# Patient Record
Sex: Female | Born: 1980 | Hispanic: Yes | Marital: Single | Smoking: Never smoker
Health system: Southern US, Community
[De-identification: ages and names within clinical notes are randomized; demographics above are authoritative.]

## PROBLEM LIST (undated history)

## (undated) DIAGNOSIS — D649 Anemia, unspecified: Secondary | ICD-10-CM

## (undated) HISTORY — PX: ABDOMINAL HYSTERECTOMY: SHX81

---

## 2004-08-07 ENCOUNTER — Ambulatory Visit: Payer: Self-pay | Admitting: *Deleted

## 2006-12-25 ENCOUNTER — Ambulatory Visit: Payer: Self-pay | Admitting: Family Medicine

## 2007-12-20 ENCOUNTER — Emergency Department: Payer: Self-pay | Admitting: Emergency Medicine

## 2018-04-01 ENCOUNTER — Emergency Department
Admission: EM | Admit: 2018-04-01 | Discharge: 2018-04-01 | Disposition: A | Discharge status: Home / Self Care | Payer: Self-pay | Attending: Student in an Organized Health Care Education/Training Program | Admitting: Student in an Organized Health Care Education/Training Program

## 2018-04-01 ENCOUNTER — Encounter: Payer: Self-pay | Admitting: Emergency Medicine

## 2018-04-01 DIAGNOSIS — D509 Iron deficiency anemia, unspecified: Secondary | ICD-10-CM

## 2018-04-01 HISTORY — DX: Anemia, unspecified: D64.9

## 2018-04-01 LAB — FERRITIN: Ferritin: 1 ng/mL — ABNORMAL LOW (ref 11–307)

## 2018-04-01 LAB — IRON AND TIBC
Iron: 12 ug/dL — ABNORMAL LOW (ref 28–170)
Saturation Ratios: 2 % — ABNORMAL LOW (ref 10.4–31.8)
TIBC: 522 ug/dL — ABNORMAL HIGH (ref 250–450)
UIBC: 510 ug/dL

## 2018-04-01 LAB — COMPREHENSIVE METABOLIC PANEL
ALT: 24 U/L (ref 0–44)
ANION GAP: 9 (ref 5–15)
AST: 19 U/L (ref 15–41)
Albumin: 4.1 g/dL (ref 3.5–5.0)
Alkaline Phosphatase: 61 U/L (ref 38–126)
BILIRUBIN TOTAL: 0.3 mg/dL (ref 0.3–1.2)
BUN: 14 mg/dL (ref 6–20)
CHLORIDE: 104 mmol/L (ref 98–111)
CO2: 24 mmol/L (ref 22–32)
Calcium: 9.1 mg/dL (ref 8.9–10.3)
Creatinine, Ser: 0.63 mg/dL (ref 0.44–1.00)
GFR calc Af Amer: >60 mL/min (ref >60)
GFR calc non Af Amer: >60 mL/min (ref >60)
GLUCOSE: 108 mg/dL — AB (ref 70–99)
POTASSIUM: 4 mmol/L (ref 3.5–5.1)
Sodium: 137 mmol/L (ref 135–145)
Total Protein: 7.6 g/dL (ref 6.5–8.1)

## 2018-04-01 LAB — URINALYSIS, COMPLETE (UACMP) WITH MICROSCOPIC
BACTERIA UA: NONE SEEN
BILIRUBIN URINE: NEGATIVE
Glucose, UA: NEGATIVE mg/dL
Hgb urine dipstick: NEGATIVE
KETONES UR: NEGATIVE mg/dL
LEUKOCYTES UA: NEGATIVE
Nitrite: NEGATIVE
PH: 6 (ref 5.0–8.0)
PROTEIN: NEGATIVE mg/dL
Specific Gravity, Urine: 1.003 — ABNORMAL LOW (ref 1.005–1.030)

## 2018-04-01 LAB — CBC
HEMATOCRIT: 24.2 % — AB (ref 36.0–46.0)
HEMOGLOBIN: 6.8 g/dL — AB (ref 12.0–15.0)
MCH: 18.1 pg — AB (ref 26.0–34.0)
MCHC: 28.1 g/dL — ABNORMAL LOW (ref 30.0–36.0)
MCV: 64.4 fL — AB (ref 80.0–100.0)
NRBC: 0 % (ref 0.0–0.2)
Platelets: 492 10*3/uL — ABNORMAL HIGH (ref 150–400)
RBC: 3.76 MIL/uL — AB (ref 3.87–5.11)
RDW: 19.1 % — ABNORMAL HIGH (ref 11.5–15.5)
WBC: 8 10*3/uL (ref 4.0–10.5)

## 2018-04-01 LAB — TYPE AND SCREEN
ABO/RH(D): A POS
ANTIBODY SCREEN: NEGATIVE

## 2018-04-01 LAB — FOLATE: Folate: 6.8 ng/mL (ref >5.9)

## 2018-04-01 MED ORDER — FERROUS SULFATE 325 (65 FE) MG PO TABS
325.0000 mg | ORAL_TABLET | Freq: Once | ORAL | Status: AC
  Administered 2018-04-01: 325 mg via ORAL
  Filled 2018-04-01: qty 1

## 2018-04-01 MED ORDER — IRON 325 (65 FE) MG PO TABS: 1.0000 | ORAL_TABLET | ORAL | 4 refills | Status: AC

## 2018-04-01 NOTE — ED Notes (Signed)
Pharmacy notified to send Ferrous Sulfate tablet.

## 2018-04-01 NOTE — ED Triage Notes (Signed)
Pt denies symptoms of any kind. States she was a little tired but didn't think anything of it.

## 2018-04-01 NOTE — ED Provider Notes (Signed)
[facility] Emergency Department Provider Note    First MD Initiated Contact with Patient 04/01/18 1733     (approximate)  I have reviewed the triage vital signs and the nursing notes.   HISTORY  Chief Complaint Abnormal Lab    HPI Karen Huff is a 37 y.o. female with a history of iron deficiency anemia presents the ER for evaluation of abnormal hemoglobin levels done at clinic this past Tuesday.  States that she does have a history of heavy menstrual bleeding with the last menstrual cycle being last week.  Denies any active bleeding.  Denies any melena or hematochezia.  States that she previously took iron supplementation but has not done that in several months.  Denies any chest pain or shortness of breath.  States she has felt a little bit more fatigued over the past few weeks.  No recent trauma.     Past Medical History:  Diagnosis Date  . Anemia    No family history on file. History reviewed. No pertinent surgical history. There are no active problems to display for this patient.     Prior to Admission medications   Medication Sig Start Date End Date Taking? Authorizing Provider  Ferrous Sulfate (IRON) 325 (65 Fe) MG TABS Take 1 tablet (325 mg total) by mouth every other day. 04/01/18   Willy Eddyobinson, Clotile Whittington, MD    Allergies Patient has no allergy information on record.    Social History Social History   Tobacco Use  . Smoking status: Not on file  Substance Use Topics  . Alcohol use: Not on file  . Drug use: Not on file    Review of Systems Patient denies headaches, rhinorrhea, blurry vision, numbness, shortness of breath, chest pain, edema, cough, abdominal pain, nausea, vomiting, diarrhea, dysuria, fevers, rashes or hallucinations unless otherwise stated above in HPI. ____________________________________________   PHYSICAL EXAM:  VITAL SIGNS: Vitals:   04/01/18 1815 04/01/18 1817  BP: 125/80   Pulse:  82  Resp:      Temp:    SpO2:  100%    Constitutional: Alert and oriented.  Eyes: Conjunctivae are normal.  Head: Atraumatic. Nose: No congestion/rhinnorhea. Mouth/Throat: Mucous membranes are moist.   Neck: No stridor. Painless ROM.  Cardiovascular: Normal rate, regular rhythm. Grossly normal heart sounds.  Good peripheral circulation. Respiratory: Normal respiratory effort.  No retractions. Lungs CTAB. Gastrointestinal: Soft and nontender. No distention. No abdominal bruits. No CVA tenderness. Genitourinary: deferred Musculoskeletal: No lower extremity tenderness nor edema.  No joint effusions. Neurologic:  Normal speech and language. No gross focal neurologic deficits are appreciated. No facial droop Skin:  Skin is warm, dry and intact. No rash noted. Psychiatric: Mood and affect are normal. Speech and behavior are normal.  ____________________________________________   LABS (all labs ordered are listed, but only abnormal results are displayed)  Results for orders placed or performed during the hospital encounter of 04/01/18 (from the past 24 hour(s))  CBC     Status: Abnormal   Collection Time: 04/01/18  4:32 PM  Result Value Ref Range   WBC 8.0 4.0 - 10.5 K/uL   RBC 3.76 (L) 3.87 - 5.11 MIL/uL   Hemoglobin 6.8 (L) 12.0 - 15.0 g/dL   HCT 81.124.2 (L) 91.436.0 - 78.246.0 %   MCV 64.4 (L) 80.0 - 100.0 fL   MCH 18.1 (L) 26.0 - 34.0 pg   MCHC 28.1 (L) 30.0 - 36.0 g/dL   RDW 95.619.1 (H) 21.311.5 - 08.615.5 %  Platelets 492 (H) 150 - 400 K/uL   nRBC 0.0 0.0 - 0.2 %  Urinalysis, Complete w Microscopic     Status: Abnormal   Collection Time: 04/01/18  4:32 PM  Result Value Ref Range   Color, Urine COLORLESS (A) YELLOW   APPearance CLEAR (A) CLEAR   Specific Gravity, Urine 1.003 (L) 1.005 - 1.030   pH 6.0 5.0 - 8.0   Glucose, UA NEGATIVE NEGATIVE mg/dL   Hgb urine dipstick NEGATIVE NEGATIVE   Bilirubin Urine NEGATIVE NEGATIVE   Ketones, ur NEGATIVE NEGATIVE mg/dL   Protein, ur NEGATIVE NEGATIVE mg/dL    Nitrite NEGATIVE NEGATIVE   Leukocytes, UA NEGATIVE NEGATIVE   RBC / HPF 0-5 0 - 5 RBC/hpf   WBC, UA 0-5 0 - 5 WBC/hpf   Bacteria, UA NONE SEEN NONE SEEN   Squamous Epithelial / LPF 0-5 0 - 5   Mucus PRESENT   Type and screen [facility]     Status: None   Collection Time: 04/01/18  4:32 PM  Result Value Ref Range   ABO/RH(D) A POS    Antibody Screen NEG    Sample Expiration      04/04/2018 Performed at [facility], [address] [place], [place] [address]   Comprehensive metabolic panel     Status: Abnormal   Collection Time: 04/01/18  4:32 PM  Result Value Ref Range   Sodium 137 135 - 145 mmol/L   Potassium 4.0 3.5 - 5.1 mmol/L   Chloride 104 98 - 111 mmol/L   CO2 24 22 - 32 mmol/L   Glucose, Bld 108 (H) 70 - 99 mg/dL   BUN 14 6 - 20 mg/dL   Creatinine, Ser 6.04 0.44 - 1.00 mg/dL   Calcium 9.1 8.9 - 54.0 mg/dL   Total Protein 7.6 6.5 - 8.1 g/dL   Albumin 4.1 3.5 - 5.0 g/dL   AST 19 15 - 41 U/L   ALT 24 0 - 44 U/L   Alkaline Phosphatase 61 38 - 126 U/L   Total Bilirubin 0.3 0.3 - 1.2 mg/dL   GFR calc non Af Amer >60 >60 mL/min   GFR calc Af Amer >60 >60 mL/min   Anion gap 9 5 - 15  Ferritin (Iron Binding Protein)     Status: Abnormal   Collection Time: 04/01/18  5:51 PM  Result Value Ref Range   Ferritin 1 (L) 11 - 307 ng/mL  Iron and TIBC     Status: Abnormal   Collection Time: 04/01/18  5:51 PM  Result Value Ref Range   Iron 12 (L) 28 - 170 ug/dL   TIBC 981 (H) 191 - 478 ug/dL   Saturation Ratios 2 (L) 10.4 - 31.8 %   UIBC 510 ug/dL  Folate     Status: None   Collection Time: 04/01/18  5:51 PM  Result Value Ref Range   Folate 6.8 >5.9 ng/mL   ____________________________________________ ____________________________________________  RADIOLOGY   ____________________________________________   PROCEDURES  Procedure(s) performed:  Procedures    Critical Care performed:  no ____________________________________________   INITIAL IMPRESSION / ASSESSMENT AND PLAN / ED COURSE  Pertinent labs & imaging results that were available during my care of the patient were reviewed by me and considered in my medical decision making (see chart for details).   DDX: IDA, AOCD, ABLA, malignancy  Karen Huff is a 37 y.o. who presents to the ED with symptoms and abnormal blood work as described above.  She does not have any evidence of acute blood loss anemia.  Does have history of iron deficiency anemia.  Patient is mildly pale but not ill-appearing.  Does have history of heavy vaginal bleeding but this is a chronic issue for her.  Will check iron studies.  I have a suspicion that this is iron deficiency anemia  Clinical Course as of Apr 01 1905  Thu Apr 01, 2018  1745 RDW(!): 19.1 [PR]  1857 Patient has evidence of significant iron deficiency anemia.  Does not have any evidence of acute blood loss anemia.  She is otherwise hemodynamically stable.  She is not terribly symptomatic from her anemia either seems to be more subacute.  I do think that it is in the patient's best interest that we trial oral iron supplementation given the risks associated with transfusion.   [PR]    Clinical Course User Index [PR] Willy Eddy, MD     As part of my medical decision making, I reviewed the following data within the electronic MEDICAL RECORD NUMBER Nursing notes reviewed and incorporated, Labs reviewed, notes from prior ED visits.   ____________________________________________   FINAL CLINICAL IMPRESSION(S) / ED DIAGNOSES  Final diagnoses:  Iron deficiency anemia, unspecified iron deficiency anemia type      NEW MEDICATIONS STARTED DURING THIS VISIT:  New Prescriptions   FERROUS SULFATE (IRON) 325 (65 FE) MG TABS    Take 1 tablet (325 mg total) by mouth every other day.     Note:  This document was prepared using Dragon voice recognition software and may  include unintentional dictation errors.    Willy Eddy, MD 04/01/18 [phone]

## 2018-04-01 NOTE — ED Triage Notes (Signed)
Pt reports saw her doctor and had blood work done and he called her today and told her that she needed to come to the ED because her hbg as 6. At this time the patient reports she does not need an interpreter but if she feels like she does in the future she will let us know. Pt with hx of anemia

## 2018-05-03 ENCOUNTER — Ambulatory Visit: Payer: Self-pay

## 2018-12-06 ENCOUNTER — Ambulatory Visit: Payer: Self-pay

## 2018-12-07 ENCOUNTER — Other Ambulatory Visit: Payer: Self-pay

## 2018-12-08 ENCOUNTER — Ambulatory Visit
Admission: RE | Admit: 2018-12-08 | Discharge: 2018-12-08 | Disposition: A | Discharge status: Home / Self Care | Payer: Self-pay | Source: Ambulatory Visit | Attending: Oncology | Admitting: Oncology

## 2018-12-08 ENCOUNTER — Ambulatory Visit: Payer: Self-pay | Attending: Oncology | Admitting: *Deleted

## 2018-12-08 ENCOUNTER — Other Ambulatory Visit: Payer: Self-pay

## 2018-12-08 ENCOUNTER — Encounter: Payer: Self-pay | Admitting: *Deleted

## 2018-12-08 ENCOUNTER — Encounter (INDEPENDENT_AMBULATORY_CARE_PROVIDER_SITE_OTHER): Payer: Self-pay

## 2018-12-08 VITALS — BP 113/78 | HR 75 | Temp 96.9°F | Ht 63.0 in | Wt 152.0 lb

## 2018-12-08 DIAGNOSIS — Z0000 Encounter for general adult medical examination without abnormal findings: Secondary | ICD-10-CM | POA: Insufficient documentation

## 2018-12-08 NOTE — Progress Notes (Signed)
  Subjective:     Patient ID: Azaryah Arlina Robes, female   DOB: March 06, 1981, 38 y.o.   MRN: [id number]  HPI   Review of Systems     Objective:   Physical Exam Chest:     Breasts:        Right: Tenderness present. No swelling, bleeding, inverted nipple, mass, nipple discharge or skin change.        Left: No swelling, bleeding, inverted nipple, mass, nipple discharge, skin change or tenderness.    Lymphadenopathy:     Upper Body:     Right upper body: No supraclavicular or axillary adenopathy.     Left upper body: No supraclavicular or axillary adenopathy.        Assessment:     38 year old Hispanic referred to [place] by [facility] for clinical breast exam and mammogram.  Lloyda, the interpreter present during the interview and exam.  Clinical breast exam notes tenderness to palpation at the upper outer quadrant of the right breast.  Patient is due to start her menstrual cycle this week.  Patient also has a history of right breast cysts noted on previous mammogram and ultrasound from [facility] in 2018.  There is no dominant mass, skin changes, nipple discharge or lymphadenopathy.  Taught self breast awareness.  Last pap on 03/30/18 was negative without HPV co-testing.  Next pap due in 2022.  Patient has been screened for eligibility.  She does not have any insurance, Medicare or Medicaid.  She also meets financial eligibility.  Hand-out given on the Affordable Care Act.   Risk Assessment    Risk Scores      12/08/2018   Last edited by: Theodore Demark, RN   5-year risk: 0.4 %   Lifetime risk: 8.6 %            Plan:     Screening mammogram ordered.  Will follow up per BCCCP protocol.

## 2018-12-23 ENCOUNTER — Encounter: Payer: Self-pay | Admitting: *Deleted

## 2018-12-23 NOTE — Progress Notes (Signed)
Letter mailed from the [facility] to inform patient of her normal mammogram results.  Patient is to follow-up with annual screening in one year.  HSIS to Christy. 

## 2019-02-27 HISTORY — PX: TOTAL LAPAROSCOPIC HYSTERECTOMY WITH SALPINGECTOMY: SHX6742

## 2020-01-25 ENCOUNTER — Ambulatory Visit
Admission: RE | Admit: 2020-01-25 | Discharge: 2020-01-25 | Disposition: A | Discharge status: Home / Self Care | Payer: Self-pay | Source: Ambulatory Visit | Attending: Oncology | Admitting: Oncology

## 2020-01-25 ENCOUNTER — Ambulatory Visit: Payer: Self-pay | Attending: Oncology

## 2020-01-25 ENCOUNTER — Other Ambulatory Visit: Payer: Self-pay

## 2020-01-25 VITALS — BP 123/78 | HR 65 | Temp 97.2°F | Ht 63.16 in | Wt 152.1 lb

## 2020-01-25 DIAGNOSIS — Z0000 Encounter for general adult medical examination without abnormal findings: Secondary | ICD-10-CM | POA: Insufficient documentation

## 2020-01-25 NOTE — Progress Notes (Signed)
  Subjective:     Patient ID: Karen Huff, female   DOB: April 20, 1981, 39 y.o.   MRN: [id number]  HPI   Review of Systems     Objective:   Physical Exam Chest:     Breasts:        Right: No swelling, bleeding, inverted nipple, mass, nipple discharge, skin change or tenderness.        Left: No swelling, bleeding, inverted nipple, mass, nipple discharge, skin change or tenderness.        Assessment:     39 year old Hispanic patient returns for [facility] clinic visit.  Patient screened, and meets BCCCP eligibility.  Patient does not have insurance, Medicare or Medicaid. Instructed patient on breast self awareness using teach back method.  Clinical breast exam unremarkable.  No mass or lump palpated.  Patient reports she had a hysterectomy for heavy bleeding with menstruation.  Surgery performed at [facility] November 2020.  She still has her ovaries.      Risk Assessment    Risk Scores      01/25/2020 12/08/2018   Last edited by: Scarlett Presto, RN Scarlett Presto, RN   5-year risk: 0.5 % 0.4 %   Lifetime risk: 8.5 % 8.6 %         Plan:     Sent for bilateral screening mammogram.

## 2020-02-01 NOTE — Progress Notes (Signed)
Letter mailed from [facility] to notify of normal mammogram results.  Patient to return in one year for annual screening.  Copy to HSIS. 

## 2021-01-10 IMAGING — MG DIGITAL SCREENING BILAT W/ TOMO W/ CAD
6 of 10 series · 6 of 30 positions shown · non-contrast
Comparison: Previous exam(s).

CLINICAL DATA: Screening.

EXAM:
DIGITAL SCREENING BILATERAL MAMMOGRAM WITH TOMO AND CAD

[L MLO synth-2D]
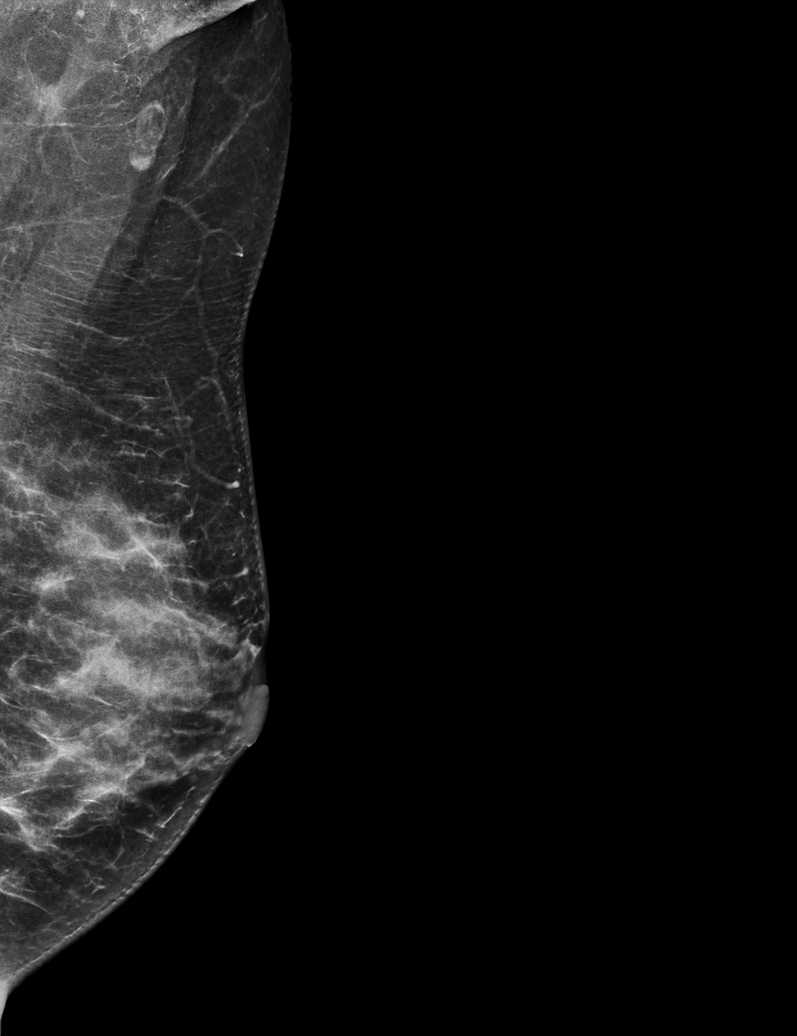

[R CC synth-2D (1 of 2)]
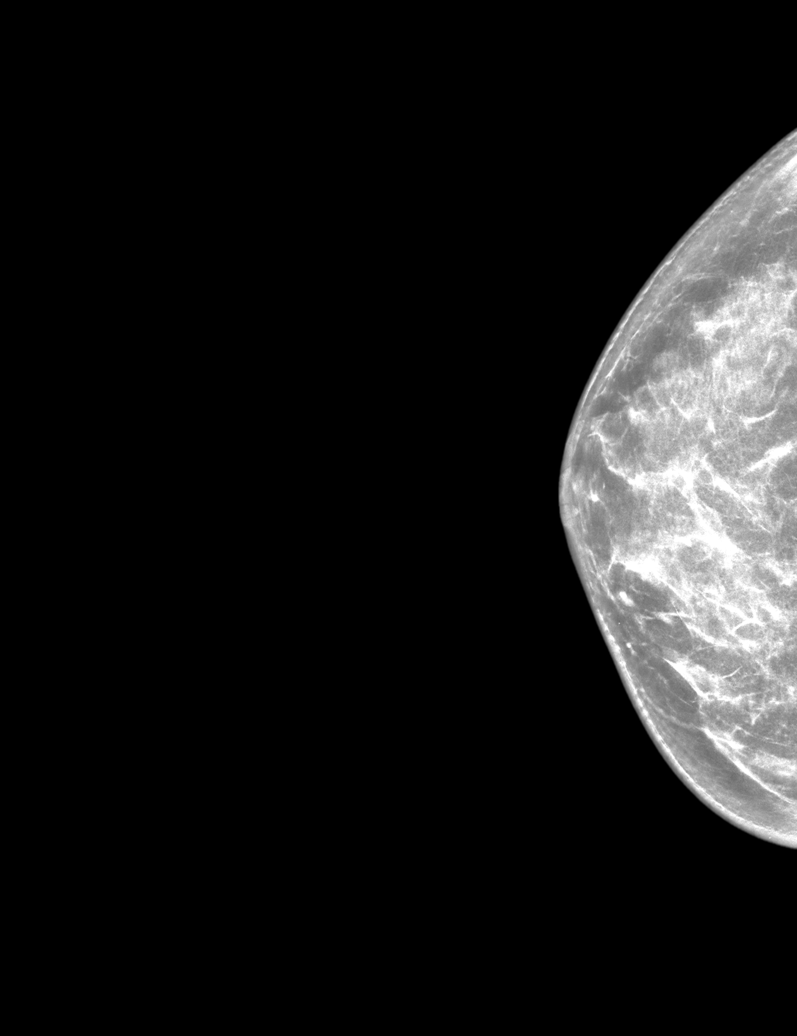

[L CC synth-2D]
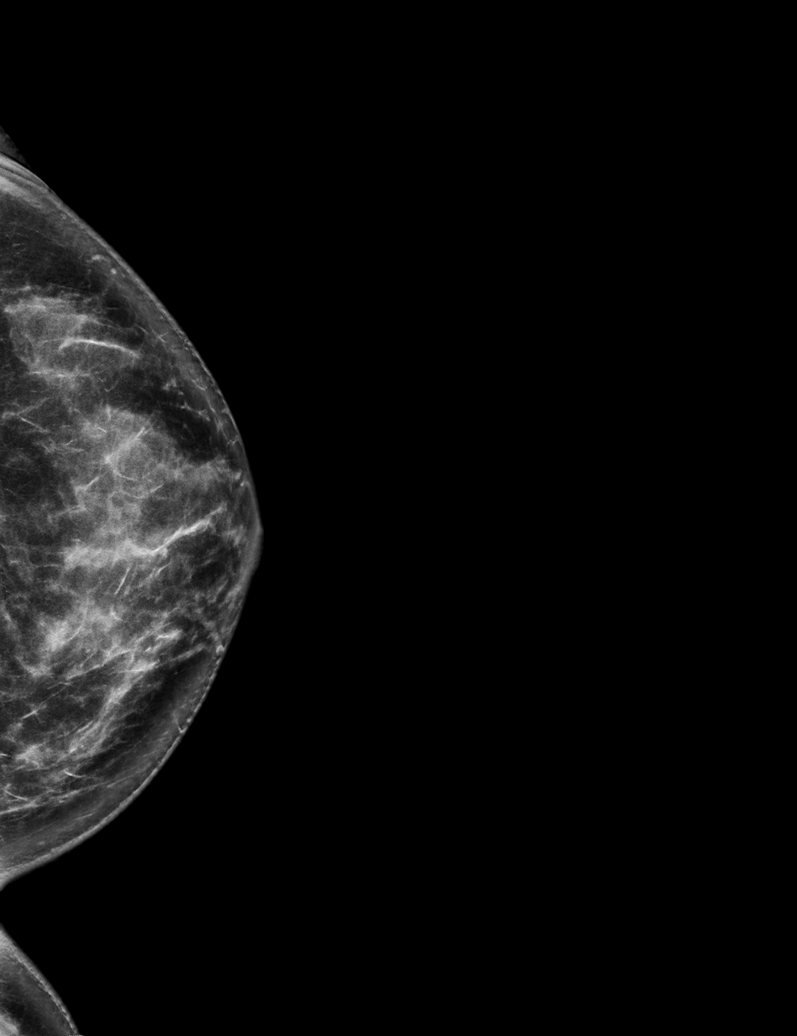

[R CC synth-2D (2 of 2)]
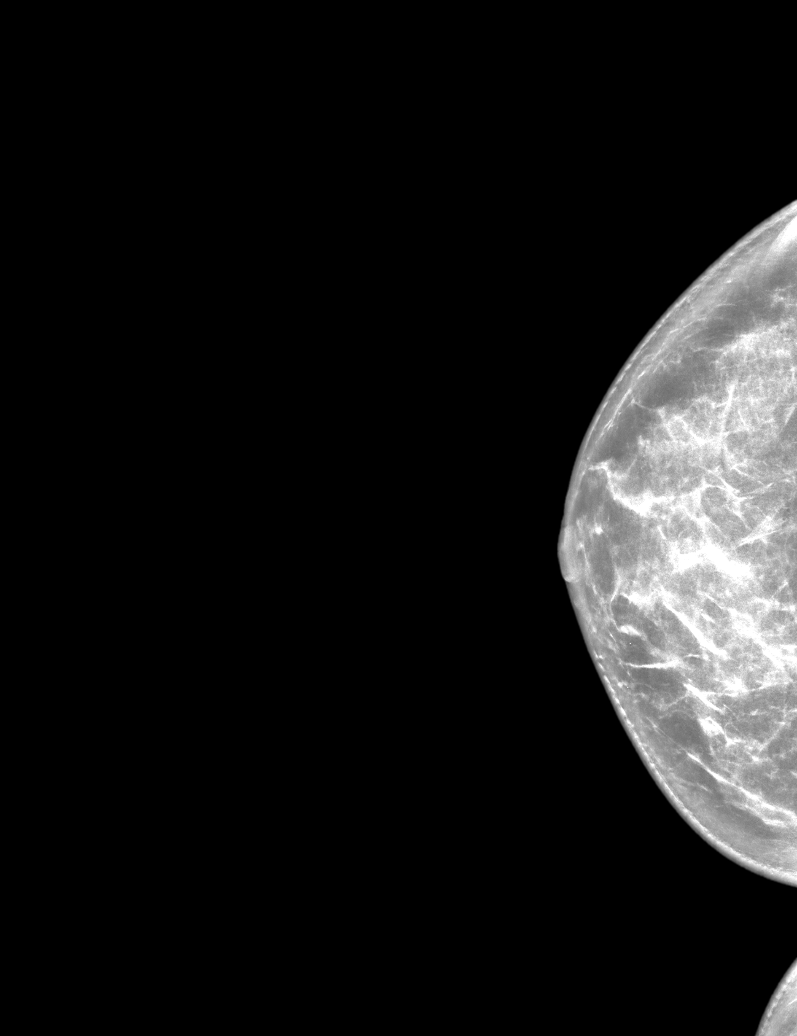

[R MLO synth-2D]
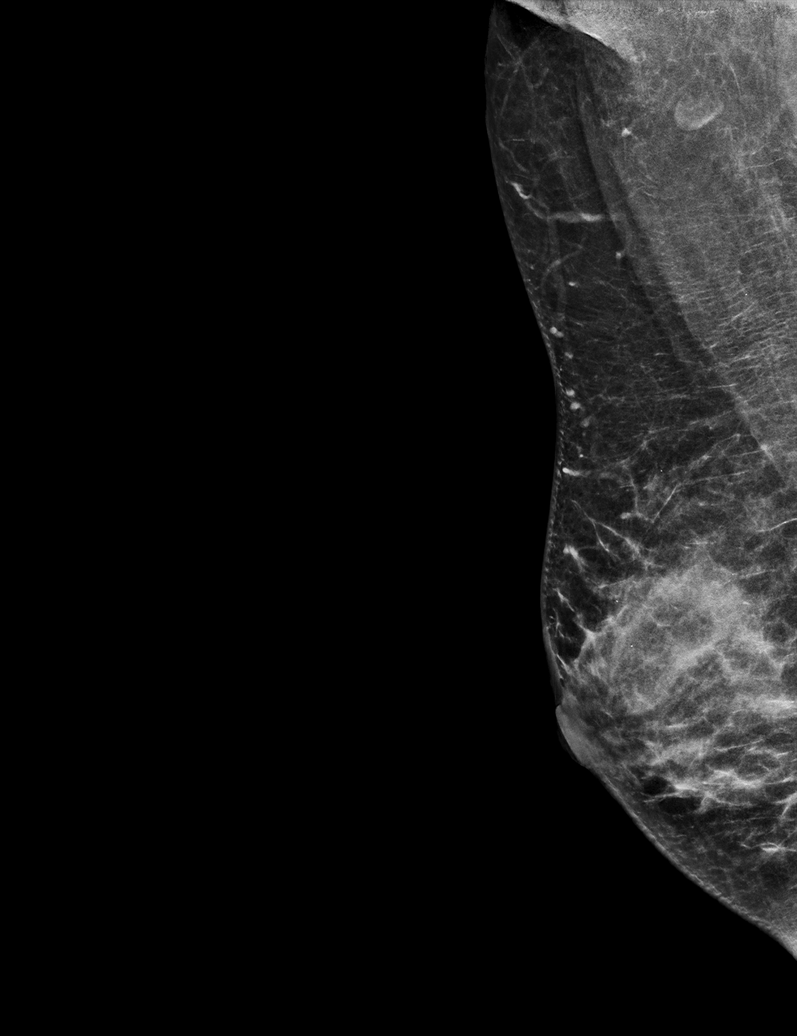

[L MLO tomo · tomo slice 29/58.0]
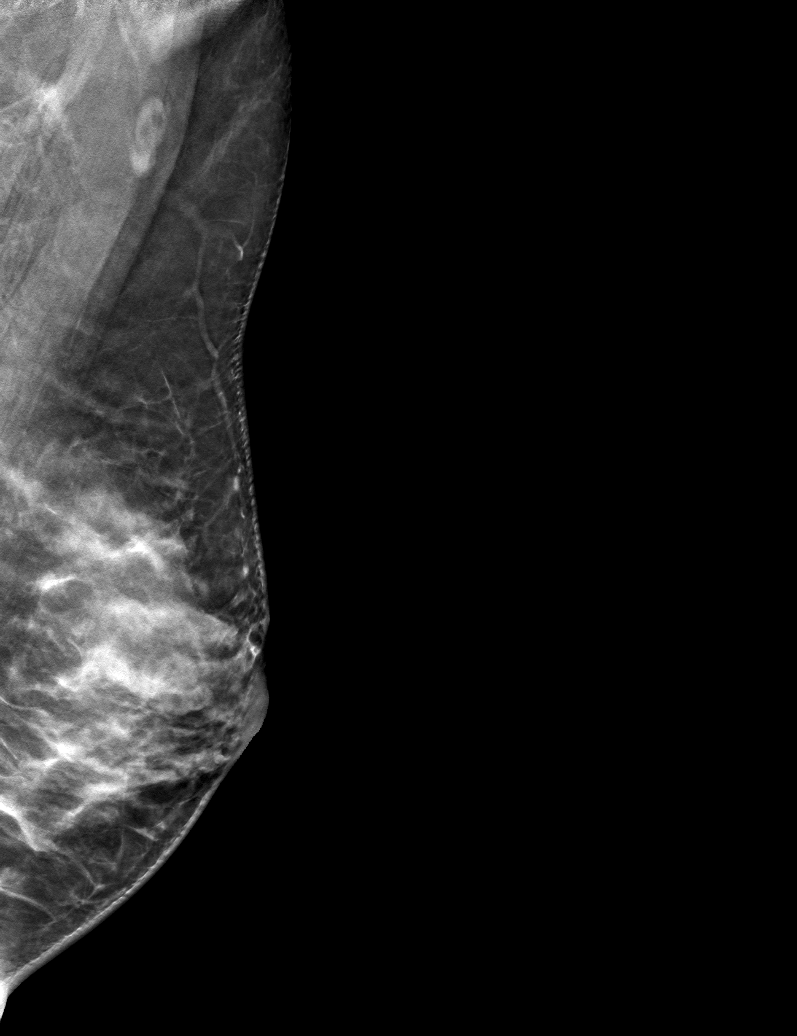

[6 of 30 positions shown; findings below may reference images not displayed]

ACR Breast Density Category c: The breast tissue is heterogeneously
dense, which may obscure small masses.
FINDINGS: There are no findings suspicious for malignancy. Images were
processed with CAD.
IMPRESSION: No mammographic evidence of malignancy. A result letter of this
screening mammogram will be mailed directly to the patient.

RECOMMENDATION:
Screening mammogram in one year. (Code:[id number]-U-LHB)

BI-RADS CATEGORY  1: Negative.

## 2021-01-29 ENCOUNTER — Ambulatory Visit: Payer: Self-pay | Attending: Oncology

## 2021-01-29 ENCOUNTER — Other Ambulatory Visit: Payer: Self-pay

## 2021-01-29 ENCOUNTER — Ambulatory Visit
Admission: RE | Admit: 2021-01-29 | Discharge: 2021-01-29 | Disposition: A | Discharge status: Home / Self Care | Payer: Self-pay | Source: Ambulatory Visit | Attending: Oncology | Admitting: Oncology

## 2021-01-29 VITALS — BP 117/67 | HR 67 | Temp 97.7°F | Ht 63.0 in | Wt 138.0 lb

## 2021-01-29 DIAGNOSIS — Z0000 Encounter for general adult medical examination without abnormal findings: Secondary | ICD-10-CM | POA: Insufficient documentation

## 2021-01-29 NOTE — Progress Notes (Signed)
  Subjective:     Patient ID: Karen Huff, female   DOB: 31-Jul-1980, 40 y.o.   MRN: [id number]  HPI   Review of Systems     Objective:   Physical Exam Chest:  Breasts:    Right: No swelling, bleeding, inverted nipple, mass, nipple discharge, skin change or tenderness.     Left: No swelling, bleeding, inverted nipple, mass, nipple discharge, skin change or tenderness.       Assessment:     40 year old Hispanic patient , fluent in [place], returns for BCCCP screening. Patient screened, and meets BCCCP eligibility.  Patient does not have insurance, Medicare or Medicaid.  Instructed patient on breast self awareness using teach back method. Clinical breast exam unremarkable. No mass or lump palpated.    Risk Assessment   No risk assessment data for the current encounter  Risk Scores       01/25/2020   Last edited by: Scarlett Presto, RN   5-year risk: 0.5 %   Lifetime risk: 8.5 %               Plan:     Sent for bilateral screening mammogram. Patient recently separated from husband. She has two teenage children.  Referred for 6 sessions of Healing Touch  to be paid through [employer].

## 2021-02-04 ENCOUNTER — Other Ambulatory Visit: Payer: Self-pay

## 2021-02-04 DIAGNOSIS — N6320 Unspecified lump in the left breast, unspecified quadrant: Secondary | ICD-10-CM

## 2021-02-13 ENCOUNTER — Ambulatory Visit
Admission: RE | Admit: 2021-02-13 | Discharge: 2021-02-13 | Disposition: A | Discharge status: Home / Self Care | Payer: Self-pay | Source: Ambulatory Visit | Attending: Oncology | Admitting: Oncology

## 2021-02-13 ENCOUNTER — Other Ambulatory Visit: Payer: Self-pay

## 2021-02-13 DIAGNOSIS — N6320 Unspecified lump in the left breast, unspecified quadrant: Secondary | ICD-10-CM

## 2021-02-14 NOTE — Progress Notes (Signed)
Letter mailed from [facility] to notify of normal mammogram results.  Patient to return in one year for annual screening.  Copy to HSIS. 

## 2021-06-27 ENCOUNTER — Ambulatory Visit: Payer: BC Managed Care – PPO | Admitting: Obstetrics and Gynecology

## 2021-06-27 ENCOUNTER — Encounter: Payer: Self-pay | Admitting: Obstetrics and Gynecology

## 2021-06-27 ENCOUNTER — Other Ambulatory Visit: Payer: Self-pay

## 2021-06-27 VITALS — BP 125/83 | HR 69 | Wt 142.0 lb

## 2021-06-27 DIAGNOSIS — L811 Chloasma: Secondary | ICD-10-CM

## 2021-06-27 DIAGNOSIS — F419 Anxiety disorder, unspecified: Secondary | ICD-10-CM | POA: Diagnosis not present

## 2021-06-27 DIAGNOSIS — Z01419 Encounter for gynecological examination (general) (routine) without abnormal findings: Secondary | ICD-10-CM | POA: Diagnosis not present

## 2021-06-27 DIAGNOSIS — Z803 Family history of malignant neoplasm of breast: Secondary | ICD-10-CM | POA: Diagnosis not present

## 2021-06-27 DIAGNOSIS — F32A Depression, unspecified: Secondary | ICD-10-CM

## 2021-06-27 NOTE — Progress Notes (Signed)
? ?Obstetrics and Gynecology ?New Patient Evaluation ? ?Appointment Date: 06/27/2021 ? ?OBGYN Clinic: [facility] ? ?Primary Care Provider: Liliane Bade C ? ?Referring Provider: Jannet Askew, MD ? ?Chief Complaint: Annual exam ? ? ?History of Present Illness: Karen Huff is a 41 y.o. P2 here for concerns that she may be in menopause.  ? ?Patient had an 02/2019 TLH/BS at [facility] for fibroids with benign pathology. She states that for the past six months she's started feeling depressed and anxious and although her s/s are improving she is wondering if it is due to going through menopause. She still has cyclic s/s of a menstrual cycle like breast tenderness and denies any hot flashes, night sweats or vaginal dryness. She states that about six months ago she went through a separation.  ? ?She is also wondering if the b/l maxillary cheek discoloration that started a few months after her hysterectomy is related to the hysterectomy.  ? ? ?Review of Systems: Pertinent items noted in HPI and remainder of comprehensive ROS otherwise negative.  ? ?Patient Active Problem List  ? Diagnosis Date Noted  ? Family history of breast cancer 06/28/2021  ? Anxiety and depression 06/28/2021  ? Melasma 06/28/2021  ? ? ? ?Past Medical History:  ?Past Medical History:  ?Diagnosis Date  ? Anemia   ? ? ?Past Surgical History:  ?Past Surgical History:  ?Procedure Laterality Date  ? CESAREAN SECTION    ? x2  ? TOTAL LAPAROSCOPIC HYSTERECTOMY WITH SALPINGECTOMY  02/2019  ? ? ?Past Obstetrical History:  ?OB History  ?Gravida Para Term Preterm AB Living  ?2 2 2     2   ?SAB IAB Ectopic Multiple Live Births  ?        2  ?  ?# Outcome Date GA Lbr Len/2nd Weight Sex Delivery Anes PTL Lv  ?2 Term           ?1 Term           ?  ?Obstetric Comments  ?C-section x 2  ? ? ?Past Gynecological History: As per HPI. ? ?Social History:  ?Social History  ? ?Socioeconomic History  ? Marital status: Single  ?  Spouse name:  Not on file  ? Number of children: Not on file  ? Years of education: Not on file  ? Highest education level: Not on file  ?Occupational History  ? Not on file  ?Tobacco Use  ? Smoking status: Never  ? Smokeless tobacco: Never  ?Substance and Sexual Activity  ? Alcohol use: Not on file  ? Drug use: Not on file  ? Sexual activity: Not on file  ?Other Topics Concern  ? Not on file  ?Social History Narrative  ? Not on file  ? ?Social Determinants of Health  ? ?Financial Resource Strain: Not on file  ?Food Insecurity: Not on file  ?Transportation Needs: Not on file  ?Physical Activity: Not on file  ?Stress: Not on file  ?Social Connections: Not on file  ?Intimate Partner Violence: Not on file  ? ? ?Family History:  ?Family History  ?Problem Relation Age of Onset  ? Breast cancer Maternal Aunt   ? ?Two cousins (late last year) and aunt dx with breast cancer; all on maternal side.  ? ?Health Maintenance:  ?Mammogram(s): Yes.   Date: 01/2021 ? ?Medications ?Bindu Hernandez Charmin had no medications administered during this visit. ?Current Outpatient Medications  ?Medication Sig Dispense Refill  ? Ferrous Sulfate (IRON) 325 (65  Fe) MG TABS Take 1 tablet (325 mg total) by mouth every other day. (Patient not taking: Reported on 06/27/2021) 30 each 4  ? ?No current facility-administered medications for this visit.  ? ? ?Allergies ?Patient has no allergy information on record. ? ? ?Physical Exam:  ?BP 125/83   Pulse 69   Wt 142 lb (64.4 kg)   LMP 11/10/2018 (Exact Date)   BMI 25.15 kg/m?  Body mass index is 25.15 kg/m?. ?General appearance: Well nourished, well developed female in no acute distress.  ?Neck:  Supple, normal appearance, and no thyromegaly  ?Cardiovascular: normal s1 and s2.  No murmurs, rubs or gallops. ?Respiratory:  Clear to auscultation bilateral. Normal respiratory effort ?Abdomen: positive bowel sounds and no masses, hernias; diffusely non tender to palpation, non distended ?Breasts: breasts appear normal,  no suspicious masses, no skin or nipple changes or axillary nodes, and normal palp[ation. ?Neuro/Psych:  Normal mood and affect.  ?Skin:  Warm and dry. B/l maxilla with slight brown discoloration c/w melasma ?Lymphatic:  No inguinal lymphadenopathy.  ? ?Pelvic exam: is not limited by body habitus ?EGBUS: within normal limits ?Vagina: within normal limits and with no blood or discharge in the vault ?Cervix: surgically absent ?Cuff: normal, nttp ?Bimanual exam negative ?Adnexa:  normal adnexa and no mass, fullness, tenderness ?Rectovaginal: deferred ? ?Laboratory: none ? ?Radiology: none ? ?Assessment: pt stable ? ?Plan:  ?1. Family history of breast cancer ?Pt amenable to genetic blood testing today ?- Genetic Screening ? ?2. Well woman exam with routine gynecological exam ?Pt would like routine labs. STI testing declined ?- CBC ?- Comprehensive metabolic panel ?- Hemoglobin A1c ?- TSH ?- Lipid panel ?- Urinalysis, Routine w reflex microscopic ? ?3. Anxiety and depression ?I told her that her s/s are not c/w menopause, and I offered to her a referral to our counselor at the main clinic Marijean Niemann), but pt declines ? ?4. Melasma ?- Ambulatory referral to Dermatology ? ?Orders Placed This Encounter  ?Procedures  ? CBC  ? Comprehensive metabolic panel  ? Hemoglobin A1c  ? TSH  ? Lipid panel  ? Urinalysis, Routine w reflex microscopic  ? Genetic Screening  ? Ambulatory referral to Dermatology  ? ? ?RTC PRN ? ?Cornelia Copa MD ?Attending ?[facility]) ? ? ? ?

## 2021-06-28 ENCOUNTER — Encounter: Payer: Self-pay | Admitting: Obstetrics and Gynecology

## 2021-06-28 DIAGNOSIS — F32A Depression, unspecified: Secondary | ICD-10-CM | POA: Insufficient documentation

## 2021-06-28 DIAGNOSIS — L811 Chloasma: Secondary | ICD-10-CM | POA: Insufficient documentation

## 2021-06-28 DIAGNOSIS — Z803 Family history of malignant neoplasm of breast: Secondary | ICD-10-CM | POA: Insufficient documentation

## 2021-06-28 DIAGNOSIS — F419 Anxiety disorder, unspecified: Secondary | ICD-10-CM | POA: Insufficient documentation

## 2021-06-28 LAB — URINALYSIS, ROUTINE W REFLEX MICROSCOPIC
Bilirubin, UA: NEGATIVE
Glucose, UA: NEGATIVE
Ketones, UA: NEGATIVE
Leukocytes,UA: NEGATIVE
Nitrite, UA: NEGATIVE
Protein,UA: NEGATIVE
RBC, UA: NEGATIVE
Specific Gravity, UA: 1.006 (ref 1.005–1.030)
Urobilinogen, Ur: 0.2 mg/dL (ref 0.2–1.0)
pH, UA: 7 (ref 5.0–7.5)

## 2021-06-29 LAB — CBC
Hematocrit: 40.2 % (ref 34.0–46.6)
Hemoglobin: 13.2 g/dL (ref 11.1–15.9)
MCH: 28.9 pg (ref 26.6–33.0)
MCHC: 32.8 g/dL (ref 31.5–35.7)
MCV: 88 fL (ref 79–97)
Platelets: 348 10*3/uL (ref 150–450)
RBC: 4.56 x10E6/uL (ref 3.77–5.28)
RDW: 13 % (ref 11.7–15.4)
WBC: 6.3 10*3/uL (ref 3.4–10.8)

## 2021-06-29 LAB — COMPREHENSIVE METABOLIC PANEL
ALT: 11 IU/L (ref 0–32)
AST: 14 IU/L (ref 0–40)
Albumin/Globulin Ratio: 2 (ref 1.2–2.2)
Albumin: 4.9 g/dL — ABNORMAL HIGH (ref 3.8–4.8)
Alkaline Phosphatase: 71 IU/L (ref 44–121)
BUN/Creatinine Ratio: 16 (ref 9–23)
BUN: 12 mg/dL (ref 6–24)
Bilirubin Total: 0.5 mg/dL (ref 0.0–1.2)
CO2: 22 mmol/L (ref 20–29)
Calcium: 9.7 mg/dL (ref 8.7–10.2)
Chloride: 103 mmol/L (ref 96–106)
Creatinine, Ser: 0.75 mg/dL (ref 0.57–1.00)
Globulin, Total: 2.4 g/dL (ref 1.5–4.5)
Glucose: 92 mg/dL (ref 70–99)
Potassium: 4.2 mmol/L (ref 3.5–5.2)
Sodium: 141 mmol/L (ref 134–144)
Total Protein: 7.3 g/dL (ref 6.0–8.5)
eGFR: 102 mL/min/{1.73_m2} (ref >59)

## 2021-06-29 LAB — LIPID PANEL
Chol/HDL Ratio: 3.9 ratio (ref 0.0–4.4)
Cholesterol, Total: 219 mg/dL — ABNORMAL HIGH (ref 100–199)
HDL: 56 mg/dL (ref >39)
LDL Chol Calc (NIH): 148 mg/dL — ABNORMAL HIGH (ref 0–99)
Triglycerides: 86 mg/dL (ref 0–149)
VLDL Cholesterol Cal: 15 mg/dL (ref 5–40)

## 2021-06-29 LAB — HEMOGLOBIN A1C
Est. average glucose Bld gHb Est-mCnc: 111 mg/dL
Hgb A1c MFr Bld: 5.5 % (ref 4.8–5.6)

## 2021-06-29 LAB — TSH: TSH: 1.57 u[IU]/mL (ref 0.450–4.500)

## 2021-07-11 ENCOUNTER — Encounter: Payer: Self-pay | Admitting: *Deleted

## 2021-12-05 ENCOUNTER — Ambulatory Visit: Payer: BC Managed Care – PPO | Admitting: Dermatology

## 2021-12-16 ENCOUNTER — Ambulatory Visit: Payer: BC Managed Care – PPO | Admitting: Dermatology

## 2022-06-03 ENCOUNTER — Ambulatory Visit (INDEPENDENT_AMBULATORY_CARE_PROVIDER_SITE_OTHER): Payer: BC Managed Care – PPO

## 2022-06-03 ENCOUNTER — Ambulatory Visit
Admission: EM | Admit: 2022-06-03 | Discharge: 2022-06-03 | Disposition: A | Discharge status: Home / Self Care | Payer: BC Managed Care – PPO | Attending: Emergency Medicine | Admitting: Emergency Medicine

## 2022-06-03 DIAGNOSIS — M25521 Pain in right elbow: Secondary | ICD-10-CM

## 2022-06-03 MED ORDER — IBUPROFEN 600 MG PO TABS: 600.0000 mg | ORAL_TABLET | Freq: Four times a day (QID) | ORAL | 0 refills | Status: AC | PRN

## 2022-06-03 NOTE — ED Triage Notes (Signed)
Patient to Urgent Care with complaints of right sided arm pain. Reports initially pain started in her elbow, pain radiates into both her hand and her shoulder. Describes pain as aching.  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 Believes this could be the cause of her pain. Denies any known injury.  Symptoms started two weeks ago.

## 2022-06-03 NOTE — ED Provider Notes (Signed)
Roderic Palau    CSN: [id number] Arrival date & time: 06/03/22  1725      History   Chief Complaint Chief Complaint  Patient presents with   Arm Pain    HPI Karen Huff is a 42 y.o. female.  Patient presents with 2 week history of right elbow pain.  The pain starts in her elbow and radiates to her hand and shoulder.  No falls or injury.  The pain is worse with movement and improves with rest.  She denies wounds, redness, bruising, swelling, numbness, weakness, chest pain, shortness of breath, or other symptoms.  She attributes the arm pain to repetitive motion at work.  No OTC medication taken today.  No pertinent medical history.    The history is provided by the patient and medical records.    Past Medical History:  Diagnosis Date   Anemia     Patient Active Problem List   Diagnosis Date Noted   Family history of breast cancer 06/28/2021   Anxiety and depression 06/28/2021   Melasma 06/28/2021    Past Surgical History:  Procedure Laterality Date   CESAREAN SECTION     x2   TOTAL LAPAROSCOPIC HYSTERECTOMY WITH SALPINGECTOMY  02/2019    OB History     Gravida  2   Para  2   Term  2   Preterm      AB      Living  2      SAB      IAB      Ectopic      Multiple      Live Births  2        Obstetric Comments  C-section x 2           Home Medications    Prior to Admission medications   Medication Sig Start Date End Date Taking? Authorizing Provider  ibuprofen (ADVIL) 600 MG tablet Take 1 tablet (600 mg total) by mouth every 6 (six) hours as needed. 06/03/22  Yes Sharion Balloon, NP  Ferrous Sulfate (IRON) 325 (65 Fe) MG TABS Take 1 tablet (325 mg total) by mouth every other day. Patient not taking: Reported on 06/27/2021 04/01/18   Merlyn Lot, MD    Family History Family History  Problem Relation Age of Onset   Breast cancer Maternal Aunt     Social History Social History   Tobacco Use   Smoking status: Never    Smokeless tobacco: Never  Substance Use Topics   Alcohol use: Never   Drug use: Never     Allergies   Patient has no known allergies.   Review of Systems Review of Systems  Constitutional:  Negative for chills and fever.  Respiratory:  Negative for cough and shortness of breath.   Cardiovascular:  Negative for chest pain and palpitations.  Musculoskeletal:  Positive for arthralgias. Negative for joint swelling.  Skin:  Negative for color change, rash and wound.  Neurological:  Negative for weakness and numbness.  All other systems reviewed and are negative.    Physical Exam Triage Vital Signs ED Triage Vitals  Enc Vitals Group     BP 06/03/22 1807 131/87     Pulse Rate 06/03/22 1757 76     Resp 06/03/22 1757 18     Temp 06/03/22 1757 98.1 F (36.7 C)     Temp src --      SpO2 06/03/22 1757 97 %     Weight --  Height --      Head Circumference --      Peak Flow --      Pain Score 06/03/22 1804 7     Pain Loc --      Pain Edu? --      Excl. in [place]? --    No data found.  Updated Vital Signs BP 131/87   Pulse 76   Temp 98.1 F (36.7 C)   Resp 18   LMP 11/10/2018 (Exact Date)   SpO2 97%   Visual Acuity Right Eye Distance:   Left Eye Distance:   Bilateral Distance:    Right Eye Near:   Left Eye Near:    Bilateral Near:     Physical Exam Vitals and nursing note reviewed.  Constitutional:      General: She is not in acute distress.    Appearance: Normal appearance. She is well-developed. She is not ill-appearing.  HENT:     Mouth/Throat:     Mouth: Mucous membranes are moist.  Eyes:     Conjunctiva/sclera: Conjunctivae normal.  Cardiovascular:     Rate and Rhythm: Normal rate and regular rhythm.     Heart sounds: Normal heart sounds.  Pulmonary:     Effort: Pulmonary effort is normal. No respiratory distress.     Breath sounds: Normal breath sounds.  Musculoskeletal:        General: Tenderness present. No swelling, deformity or signs of  injury. Normal range of motion.       Arms:     Cervical back: Neck supple.     Comments: RUE:  FROM, sensation intact, 2+ pulses, strength 5/5.  No wounds, erythema, ecchymosis, edema.    Skin:    General: Skin is warm and dry.     Capillary Refill: Capillary refill takes less than 2 seconds.     Findings: No bruising, erythema, lesion or rash.  Neurological:     General: No focal deficit present.     Mental Status: She is alert and oriented to person, place, and time.     Sensory: No sensory deficit.     Motor: No weakness.  Psychiatric:        Mood and Affect: Mood normal.        Behavior: Behavior normal.      UC Treatments / Results  Labs (all labs ordered are listed, but only abnormal results are displayed) Labs Reviewed - No data to display  EKG   Radiology DG Elbow Complete Right  Result Date: 06/03/2022 CLINICAL DATA:  Pain over the last 2 weeks.  No known injury. EXAM: RIGHT ELBOW - COMPLETE 3+ VIEW COMPARISON:  None Available. FINDINGS: There is no evidence of fracture, dislocation, or joint effusion. There is no evidence of arthropathy or other focal bone abnormality. Soft tissues are unremarkable. IMPRESSION: Negative. Electronically Signed   By: Nelson Chimes M.D.   On: 06/03/2022 18:44    Procedures Procedures (including critical care time)  Medications Ordered in UC Medications - No data to display  Initial Impression / Assessment and Plan / UC Course  I have reviewed the triage vital signs and the nursing notes.  Pertinent labs & imaging results that were available during my care of the patient were reviewed by me and considered in my medical decision making (see chart for details).    Right elbow pain.  Xray negative.  Treating with rest, elevation, ice packs, ibuprofen.  Education provided on musculoskeletal  pain.  Instructed patient to  follow-up with orthopedics if her symptoms are not improving.  Contact information for on-call Ortho provided.   Patient agrees to plan of care.   Final Clinical Impressions(s) / UC Diagnoses   Final diagnoses:  Right elbow pain     Discharge Instructions      Take the ibuprofen as prescribed.  Rest and elevate your elbow.  Follow up with an orthopedist if your symptoms are not improving.        ED Prescriptions     Medication Sig Dispense Auth. Provider   ibuprofen (ADVIL) 600 MG tablet Take 1 tablet (600 mg total) by mouth every 6 (six) hours as needed. 30 tablet Sharion Balloon, NP      PDMP not reviewed this encounter.   Sharion Balloon, NP 06/03/22 [phone]

## 2022-06-03 NOTE — Discharge Instructions (Addendum)
Take the ibuprofen as prescribed.  Rest and elevate your elbow.  Follow up with an orthopedist if your symptoms are not improving.
